# Patient Record
Sex: Male | Born: 1955 | Race: White | Hispanic: No | Marital: Married | Smoking: Current every day smoker
Health system: Southern US, Community
[De-identification: ages and names within clinical notes are randomized; demographics above are authoritative.]

## PROBLEM LIST (undated history)

## (undated) DIAGNOSIS — I10 Essential (primary) hypertension: Secondary | ICD-10-CM

## (undated) DIAGNOSIS — F419 Anxiety disorder, unspecified: Secondary | ICD-10-CM

## (undated) DIAGNOSIS — J984 Other disorders of lung: Secondary | ICD-10-CM

## (undated) DIAGNOSIS — D751 Secondary polycythemia: Secondary | ICD-10-CM

## (undated) HISTORY — DX: Anxiety disorder, unspecified: F41.9

## (undated) HISTORY — DX: Secondary polycythemia: D75.1

## (undated) HISTORY — DX: Essential (primary) hypertension: I10

## (undated) HISTORY — DX: Other disorders of lung: J98.4

---

## 2011-10-07 ENCOUNTER — Ambulatory Visit (INDEPENDENT_AMBULATORY_CARE_PROVIDER_SITE_OTHER): Payer: BC Managed Care – PPO | Admitting: Family Medicine

## 2011-10-07 VITALS — BP 162/98 | HR 62 | Temp 98.2°F | Resp 16 | Ht 67.5 in | Wt 167.2 lb

## 2011-10-07 DIAGNOSIS — F17200 Nicotine dependence, unspecified, uncomplicated: Secondary | ICD-10-CM

## 2011-10-07 DIAGNOSIS — I10 Essential (primary) hypertension: Secondary | ICD-10-CM

## 2011-10-07 DIAGNOSIS — N529 Male erectile dysfunction, unspecified: Secondary | ICD-10-CM

## 2011-10-07 MED ORDER — BENAZEPRIL HCL 10 MG PO TABS: 10.0000 mg | ORAL_TABLET | Freq: Every day | ORAL | Status: DC

## 2011-10-07 MED ORDER — CHLORTHALIDONE 25 MG PO TABS: 25.0000 mg | ORAL_TABLET | Freq: Every day | ORAL | Status: DC

## 2011-10-07 NOTE — Progress Notes (Signed)
  Subjective:    Patient ID: Henry Short, male    DOB: 05-29-1956, 56 y.o.   MRN: [id number]  HPI  Patient presents for routine follow up of hypertension Occasional missed pill; denies side effects  Review of Systems  Respiratory: Negative for shortness of breath.   Cardiovascular: Negative for chest pain and leg swelling.  Neurological: Negative for headaches.   ED less interest and more difficulty achieving and maintaining erection    Objective:   Physical Exam  Constitutional: He appears well-developed.  Neck: Neck supple. No JVD present. No thyromegaly present.  Cardiovascular: Normal rate, regular rhythm and normal heart sounds.   Pulmonary/Chest: Effort normal and breath sounds normal.  Abdominal: Soft. Bowel sounds are normal.       No HSM  Musculoskeletal: Normal range of motion.  Neurological: He is alert.  Skin:       No LE edema       Assessment & Plan:   1. HTN (hypertension)  Lipid panel, Comprehensive metabolic panel, TSH, benazepril (LOTENSIN) 10 MG tablet, chlorthalidone (HYGROTON) 25 MG tablet  2. ED (erectile dysfunction)  Testosterone  3. Tobacco dependence     Anticipatory guidance

## 2011-10-08 LAB — LIPID PANEL
HDL: 38 mg/dL — ABNORMAL LOW (ref >39)
Total CHOL/HDL Ratio: 6.1 Ratio

## 2011-10-08 LAB — COMPREHENSIVE METABOLIC PANEL
ALT: 31 U/L (ref 0–53)
AST: 24 U/L (ref 0–37)
Alkaline Phosphatase: 41 U/L (ref 39–117)
Creat: 0.95 mg/dL (ref 0.50–1.35)
Sodium: 135 mEq/L (ref 135–145)
Total Bilirubin: 0.8 mg/dL (ref 0.3–1.2)
Total Protein: 6.8 g/dL (ref 6.0–8.3)

## 2011-10-13 ENCOUNTER — Other Ambulatory Visit: Payer: Self-pay | Admitting: Family Medicine

## 2011-10-13 DIAGNOSIS — E781 Pure hyperglyceridemia: Secondary | ICD-10-CM

## 2011-10-13 MED ORDER — CHOLINE FENOFIBRATE 135 MG PO CPDR: 135.0000 mg | DELAYED_RELEASE_CAPSULE | Freq: Every day | ORAL | Status: DC

## 2011-10-14 MED ORDER — TADALAFIL 5 MG PO TABS: 5.0000 mg | ORAL_TABLET | Freq: Every day | ORAL | Status: DC | PRN

## 2011-10-14 NOTE — Progress Notes (Signed)
Addended by: Sondra Barges on: 10/14/2011 10:01 AM   Modules accepted: Orders

## 2013-05-27 ENCOUNTER — Ambulatory Visit: Payer: Self-pay

## 2013-05-27 ENCOUNTER — Ambulatory Visit: Payer: Self-pay | Admitting: Emergency Medicine

## 2013-05-27 VITALS — BP 170/100 | HR 91 | Temp 99.2°F | Resp 18 | Ht 67.5 in | Wt 157.0 lb

## 2013-05-27 DIAGNOSIS — F419 Anxiety disorder, unspecified: Secondary | ICD-10-CM

## 2013-05-27 DIAGNOSIS — F17200 Nicotine dependence, unspecified, uncomplicated: Secondary | ICD-10-CM

## 2013-05-27 DIAGNOSIS — F411 Generalized anxiety disorder: Secondary | ICD-10-CM

## 2013-05-27 DIAGNOSIS — J0100 Acute maxillary sinusitis, unspecified: Secondary | ICD-10-CM

## 2013-05-27 DIAGNOSIS — F1920 Other psychoactive substance dependence, uncomplicated: Secondary | ICD-10-CM

## 2013-05-27 DIAGNOSIS — I10 Essential (primary) hypertension: Secondary | ICD-10-CM

## 2013-05-27 LAB — POCT CBC
Granulocyte percent: 77.1 %G (ref 37–80)
Lymph, poc: 0.8 (ref 0.6–3.4)
MCHC: 32.8 g/dL (ref 31.8–35.4)
MID (cbc): 0.7 (ref 0–0.9)
POC Granulocyte: 4.9 (ref 2–6.9)
POC LYMPH PERCENT: 12.3 %L (ref 10–50)
Platelet Count, POC: 106 10*3/uL — AB (ref 142–424)
RDW, POC: 14.3 %

## 2013-05-27 MED ORDER — AMOXICILLIN-POT CLAVULANATE 875-125 MG PO TABS: 1.0000 | ORAL_TABLET | Freq: Two times a day (BID) | ORAL | Status: DC

## 2013-05-27 MED ORDER — ATENOLOL-CHLORTHALIDONE 50-25 MG PO TABS: 1.0000 | ORAL_TABLET | Freq: Every day | ORAL | Status: DC

## 2013-05-27 MED ORDER — LORAZEPAM 0.5 MG PO TABS: 0.5000 mg | ORAL_TABLET | Freq: Two times a day (BID) | ORAL | Status: DC | PRN

## 2013-05-27 NOTE — Progress Notes (Addendum)
Subjective:    Patient ID: Henry Short, male    DOB: 1956/02/19, 57 y.o.   MRN: [id number] This chart was scribed for Lesle Chris, MD by Nicholos Johns, Medical Scribe. This patient's care was started at 12:00 PM.  HPI  HPI Comments: Henry Short is a 57 y.o. male w/hx of hypertension presents to the [facility] complaining of nasal congestion and associated coughing, onset 4 weeks ago. Pt states he is under a lot of stress due to his fathers current illness and working a lot of hours at his job. Pt admits to smoking a pack of cigarettes per day and recently been drinking approximately 5 glasses of wine per day. Pt admits both habits have been occurring consistently over the past 20 years. Pt states he has cut down in the past but has recently picked back up his drinking habits due to stress. Pt takes benazepril for hypertension; denies any recent check ups. Pt does not have a PCP.  BP reading taken in the room was noted at 160/110.   Review of Systems  HENT: Positive for congestion.   Respiratory: Positive for cough.        Objective:   Physical Exam  Vitals reviewed.   CONSTITUTIONAL: Well developed/well nourished HEAD: Normocephalic/atraumatic EYES: EOMI ENMT: Mucous membranes moist NECK: supple no meningeal signs SPINE:entire spine nontender CV: S1/S2 noted, no murmurs/rubs/gallops noted LUNGS: Lungs are clear to auscultation bilaterally, no apparent distress ABDOMEN: soft, nontender, no rebound or guarding GU:no cva tenderness NEURO: Pt is awake/alert, moves all extremitiesx4 EXTREMITIES: pulses normal, full ROM SKIN: warm, color normal patient is ruddy complected. 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68  4.69 - 6.13 M/uL   Hemoglobin 20.1 (*) 14.1 - 18.1 g/dL   HCT, POC 84.1 (*) 32.4 - 53.7 %   MCV 107.9 (*) 80 - 97 fL   MCH, POC 35.4 (*) 27 - 31.2 pg   MCHC 32.8  31.8 - 35.4 g/dL   RDW, POC 40.1     Platelet Count, POC 106 (*) 142 - 424 K/uL   MPV 10.7  0 - 99.8 fL  [facility] reading (PRIMARY) by  Dr. Cleta Alberts chest x-ray shows no acute disease Waters' view shows small amount of fluid in the base of the left maxillary       Assessment & Plan:  We need to address his lifestyle issues which concern is smoking and drinking. We'll treat his sinusitis with Augmentin he does have polycythemia I suspect secondary to his heavy cigarette smoking I personally performed the services described in this documentation, which was  scribed in my presence. The recorded information has been reviewed and is accurate.

## 2013-05-27 NOTE — Patient Instructions (Signed)
Please return to clinic January 1. We will will discuss your  other medical issues at that time

## 2013-05-28 LAB — COMPREHENSIVE METABOLIC PANEL
ALT: 25 U/L (ref 0–53)
AST: 21 U/L (ref 0–37)
Alkaline Phosphatase: 49 U/L (ref 39–117)
CO2: 26 mEq/L (ref 19–32)
Calcium: 9.1 mg/dL (ref 8.4–10.5)
Chloride: 101 mEq/L (ref 96–112)
Creat: 0.79 mg/dL (ref 0.50–1.35)
Sodium: 137 mEq/L (ref 135–145)
Total Bilirubin: 0.9 mg/dL (ref 0.3–1.2)
Total Protein: 6.6 g/dL (ref 6.0–8.3)

## 2013-05-28 LAB — PSA: PSA: 0.71 ng/mL (ref <=4.00)

## 2013-05-28 LAB — TSH: TSH: 0.455 u[IU]/mL (ref 0.350–4.500)

## 2013-09-27 ENCOUNTER — Ambulatory Visit (INDEPENDENT_AMBULATORY_CARE_PROVIDER_SITE_OTHER): Payer: BC Managed Care – PPO | Admitting: Emergency Medicine

## 2013-09-27 VITALS — BP 180/100 | HR 58 | Temp 97.6°F | Resp 16 | Ht 69.0 in | Wt 158.2 lb

## 2013-09-27 DIAGNOSIS — G4700 Insomnia, unspecified: Secondary | ICD-10-CM

## 2013-09-27 DIAGNOSIS — Z23 Encounter for immunization: Secondary | ICD-10-CM

## 2013-09-27 DIAGNOSIS — F419 Anxiety disorder, unspecified: Secondary | ICD-10-CM

## 2013-09-27 DIAGNOSIS — Z0000 Encounter for general adult medical examination without abnormal findings: Secondary | ICD-10-CM

## 2013-09-27 DIAGNOSIS — I10 Essential (primary) hypertension: Secondary | ICD-10-CM

## 2013-09-27 DIAGNOSIS — Z1211 Encounter for screening for malignant neoplasm of colon: Secondary | ICD-10-CM

## 2013-09-27 LAB — TSH: TSH: 0.84 u[IU]/mL (ref 0.350–4.500)

## 2013-09-27 LAB — POCT CBC
GRANULOCYTE PERCENT: 55.8 % (ref 37–80)
HEMATOCRIT: 60.8 % — AB (ref 43.5–53.7)
Hemoglobin: 20.3 g/dL — AB (ref 14.1–18.1)
Lymph, poc: 3.2 (ref 0.6–3.4)
MCH, POC: 36.3 pg — AB (ref 27–31.2)
MCHC: 33.4 g/dL (ref 31.8–35.4)
MCV: 108.8 fL — AB (ref 80–97)
MID (cbc): 0.7 (ref 0–0.9)
MPV: 11.7 fL (ref 0–99.8)
PLATELET COUNT, POC: 159 10*3/uL (ref 142–424)
POC Granulocyte: 4.9 (ref 2–6.9)
POC LYMPH %: 36.3 % (ref 10–50)
POC MID %: 7.9 %M (ref 0–12)
RBC: 5.59 M/uL (ref 4.69–6.13)
RDW, POC: 13.4 %
WBC: 8.8 10*3/uL (ref 4.6–10.2)

## 2013-09-27 LAB — LIPID PANEL
Cholesterol: 240 mg/dL — ABNORMAL HIGH (ref 0–200)
HDL: 68 mg/dL (ref >39)
LDL CALC: 150 mg/dL — AB (ref 0–99)
Total CHOL/HDL Ratio: 3.5 Ratio
Triglycerides: 108 mg/dL (ref <150)
VLDL: 22 mg/dL (ref 0–40)

## 2013-09-27 LAB — COMPREHENSIVE METABOLIC PANEL
ALBUMIN: 4.7 g/dL (ref 3.5–5.2)
ALK PHOS: 38 U/L — AB (ref 39–117)
ALT: 31 U/L (ref 0–53)
AST: 24 U/L (ref 0–37)
BUN: 12 mg/dL (ref 6–23)
CALCIUM: 9.9 mg/dL (ref 8.4–10.5)
CHLORIDE: 96 meq/L (ref 96–112)
CO2: 29 mEq/L (ref 19–32)
Creat: 0.74 mg/dL (ref 0.50–1.35)
Glucose, Bld: 85 mg/dL (ref 70–99)
POTASSIUM: 4 meq/L (ref 3.5–5.3)
Sodium: 136 mEq/L (ref 135–145)
Total Bilirubin: 1.2 mg/dL (ref 0.2–1.2)
Total Protein: 7.4 g/dL (ref 6.0–8.3)

## 2013-09-27 LAB — POCT URINALYSIS DIPSTICK
BILIRUBIN UA: NEGATIVE
Glucose, UA: NEGATIVE
Ketones, UA: NEGATIVE
Leukocytes, UA: NEGATIVE
NITRITE UA: NEGATIVE
PH UA: 7
PROTEIN UA: NEGATIVE
Spec Grav, UA: 1.02
Urobilinogen, UA: 1

## 2013-09-27 LAB — POCT UA - MICROSCOPIC ONLY
Bacteria, U Microscopic: NEGATIVE
CASTS, UR, LPF, POC: NEGATIVE
Crystals, Ur, HPF, POC: NEGATIVE
Epithelial cells, urine per micros: NEGATIVE
YEAST UA: NEGATIVE

## 2013-09-27 LAB — IFOBT (OCCULT BLOOD): IMMUNOLOGICAL FECAL OCCULT BLOOD TEST: NEGATIVE

## 2013-09-27 MED ORDER — BENAZEPRIL HCL 20 MG PO TABS: 20.0000 mg | ORAL_TABLET | Freq: Every day | ORAL | Status: DC

## 2013-09-27 MED ORDER — LORAZEPAM 0.5 MG PO TABS: 0.5000 mg | ORAL_TABLET | Freq: Two times a day (BID) | ORAL | Status: DC | PRN

## 2013-09-27 MED ORDER — BENAZEPRIL HCL 10 MG PO TABS: 10.0000 mg | ORAL_TABLET | Freq: Every day | ORAL | Status: DC

## 2013-09-27 NOTE — Progress Notes (Addendum)
Subjective:    Patient ID: Henry Short, male    DOB: 1955-12-22, 58 y.o.   MRN: [id number] This chart was scribed for Lesle ChrisSteven Marquist Binstock, MD by Danella Maiersaroline Early, ED Scribe. This patient was seen in room 13 and the patient's care was started at 12:41 PM.  Chief Complaint  Patient presents with  . Annual Exam    HPI HPI Comments: Henry Short is a 58 y.o. male with a h/o HTN who presents to the [facility] for an annual physical exam.  He states his BP was elevated at 180-190/115 when he went to the dentist earlier today. He states it was lower when he got here (BP at triage 158/86). He reports a 10 year h/o HTN. He states he is compliant with his BP medication but may forget to take it occasionally.   He is also requesting a refill on lorazepam. He states it helps him sleep better.  Pt states he recently switched over to E-cigarettes. He has smoked 1PPD for 30 years. He states he does not exercise regularly but sometimes he and his wife walk 30-45 minutes around the neighborhood. He drinks 2 glasses of wine per night.  PCP - No primary provider on file.  Past Medical History  Diagnosis Date  . Hypertension   . Anxiety    Current Outpatient Prescriptions on File Prior to Visit  Medication Sig Dispense Refill  . atenolol-chlorthalidone (TENORETIC) 50-25 MG per tablet Take 1 tablet by mouth daily.  30 tablet  11  . LORazepam (ATIVAN) 0.5 MG tablet Take 1 tablet (0.5 mg total) by mouth 2 (two) times daily as needed for anxiety.  30 tablet  1  . amoxicillin-clavulanate (AUGMENTIN) 875-125 MG per tablet Take 1 tablet by mouth 2 (two) times daily.  20 tablet  0  . benazepril (LOTENSIN) 10 MG tablet Take 1 tablet (10 mg total) by mouth daily.  90 tablet  3  . cetirizine (ZYRTEC) 10 MG tablet Take 10 mg by mouth daily.      . chlorthalidone (HYGROTON) 25 MG tablet Take 1 tablet (25 mg total) by mouth daily.  30 tablet  2  . Choline Fenofibrate 135 MG capsule Take 1 capsule  (135 mg total) by mouth daily.  30 capsule  2  . tadalafil (CIALIS) 5 MG tablet Take 1 tablet (5 mg total) by mouth daily as needed for erectile dysfunction.  8 tablet  6   No current facility-administered medications on file prior to visit.   No Known Allergies     Review of Systems  Constitutional: Negative for fever and chills.  Gastrointestinal: Negative for vomiting.  Skin: Negative for rash.  Neurological: Negative for weakness and numbness.       Objective:   Physical Exam CONSTITUTIONAL: Well developed/well nourished HEAD: Normocephalic/atraumatic. Wound on the right cheek at site of biopsy. Face somewhat flushed. EYES: EOMI/PERRL ENMT: Mucous membranes moist NECK: supple no meningeal signs SPINE:entire spine nontender CV: S1/S2 noted, no murmurs/rubs/gallops noted LUNGS: Lungs are clear to auscultation bilaterally, no apparent distress ABDOMEN: soft, nontender, no rebound or guarding GU:no cva tenderness. Rectal exam normal. Prostate normal without masses. NEURO: Pt is awake/alert, moves all extremitiesx4 EXTREMITIES: pulses normal, full ROM SKIN: warm, color normal PSYCH: no abnormalities of mood noted  EKG changes consistent with LVH Filed Vitals:   09/27/13 1127  BP: 158/86  Pulse: 58  Temp: 97.6 F (36.4 C)  TempSrc: Oral  Resp: 16  Height: 5\' 9"  (1.753 m)  Weight: 158 lb 3.2 oz (71.759 kg)  SpO2: 97%  EKG left ventricular hypertrophy  Results for orders placed in visit on 09/27/13  POCT CBC      Result Value Ref Range   WBC 8.8  4.6 - 10.2 K/uL   Lymph, poc 3.2  0.6 - 3.4   POC LYMPH PERCENT 36.3  10 - 50 %L   MID (cbc) 0.7  0 - 0.9   POC MID % 7.9  0 - 12 %M   POC Granulocyte 4.9  2 - 6.9   Granulocyte percent 55.8  37 - 80 %G   RBC 5.59  4.69 - 6.13 M/uL   Hemoglobin 20.3 (*) 14.1 - 18.1 g/dL   HCT, POC 16.160.8 (*) 09.643.5 - 53.7 %   MCV 108.8 (*) 80 - 97 fL   MCH, POC 36.3 (*) 27 - 31.2 pg   MCHC 33.4  31.8 - 35.4 g/dL   RDW, POC 04.513.4      Platelet Count, POC 159  142 - 424 K/uL   MPV 11.7  0 - 99.8 fL  IFOBT (OCCULT BLOOD)      Result Value Ref Range   IFOBT Negative    POCT URINALYSIS DIPSTICK      Result Value Ref Range   Color, UA dark yellow     Clarity, UA clear     Glucose, UA neg     Bilirubin, UA neg     Ketones, UA neg     Spec Grav, UA 1.020     Blood, UA trace-intact     pH, UA 7.0     Protein, UA neg     Urobilinogen, UA 1.0     Nitrite, UA neg     Leukocytes, UA Negative    POCT UA - MICROSCOPIC ONLY      Result Value Ref Range   WBC, Ur, HPF, POC 0-1     RBC, urine, microscopic 2-6     Bacteria, U Microscopic neg     Mucus, UA trace     Epithelial cells, urine per micros neg     Crystals, Ur, HPF, POC neg     Casts, Ur, LPF, POC neg     Yeast, UA neg        Assessment & Plan:  **Disclaimer: This note was dictated with voice recognition software. Similar sounding words can inadvertently be transcribed and this note may contain transcription errors which may not have been corrected upon publication of note.** Blood pressure not at goal. I'm concerned some of this has to do with excessive alcohol intake. Patient has only been taking Tenoretic. I added Lotensin 10 mg a day. He's been on this medication before. I also encouraged him to decrease his alcohol intake

## 2013-09-27 NOTE — Progress Notes (Signed)
   Subjective:    Patient ID: Henry Short, male    DOB: Dec 24, 1955, 58 y.o.   MRN: [id number]  HPI 58 y/o/ male to be seen today for a CPE History of hypertension - atenolol History of Alcohol use Current Smoker   Review of Systems     Objective:   Physical Exam        Assessment & Plan:

## 2013-09-27 NOTE — Patient Instructions (Signed)
I have increased your  blood pressure medication. Recheck 1 week and see if it is improving. Routine labs were drawnHypertension As your heart beats, it forces blood through your arteries. This force is your blood pressure. If the pressure is too high, it is called hypertension (HTN) or high blood pressure. HTN is dangerous because you may have it and not know it. High blood pressure may mean that your heart has to work harder to pump blood. Your arteries may be narrow or stiff. The extra work puts you at risk for heart disease, stroke, and other problems.  Blood pressure consists of two numbers, a higher number over a lower, 110/72, for example. It is stated as "110 over 72." The ideal is below 120 for the top number (systolic) and under 80 for the bottom (diastolic). Write down your blood pressure today. You should pay close attention to your blood pressure if you have certain conditions such as:  Heart failure.  Prior heart attack.  Diabetes  Chronic kidney disease.  Prior stroke.  Multiple risk factors for heart disease. To see if you have HTN, your blood pressure should be measured while you are seated with your arm held at the level of the heart. It should be measured at least twice. A one-time elevated blood pressure reading (especially in the Emergency Department) does not mean that you need treatment. There may be conditions in which the blood pressure is different between your right and left arms. It is important to see your caregiver soon for a recheck. Most people have essential hypertension which means that there is not a specific cause. This type of high blood pressure may be lowered by changing lifestyle factors such as:  Stress.  Smoking.  Lack of exercise.  Excessive weight.  Drug/tobacco/alcohol use.  Eating less salt. Most people do not have symptoms from high blood pressure until it has caused damage to the body. Effective treatment can often prevent, delay or reduce  that damage. TREATMENT  When a cause has been identified, treatment for high blood pressure is directed at the cause. There are a large number of medications to treat HTN. These fall into several categories, and your caregiver will help you select the medicines that are best for you. Medications may have side effects. You should review side effects with your caregiver. If your blood pressure stays high after you have made lifestyle changes or started on medicines,   Your medication(s) may need to be changed.  Other problems may need to be addressed.  Be certain you understand your prescriptions, and know how and when to take your medicine.  Be sure to follow up with your caregiver within the time frame advised (usually within two weeks) to have your blood pressure rechecked and to review your medications.  If you are taking more than one medicine to lower your blood pressure, make sure you know how and at what times they should be taken. Taking two medicines at the same time can result in blood pressure that is too low. SEEK IMMEDIATE MEDICAL CARE IF:  You develop a severe headache, blurred or changing vision, or confusion.  You have unusual weakness or numbness, or a faint feeling.  You have severe chest or abdominal pain, vomiting, or breathing problems. MAKE SURE YOU:   Understand these instructions.  Will watch your condition.  Will get help right away if you are not doing well or get worse. Document Released: 05/24/2005 Document Revised: 08/16/2011 Document Reviewed: 01/12/2008 [employer] Patient  Information 2014 [employer].  

## 2013-09-28 ENCOUNTER — Encounter: Payer: Self-pay | Admitting: Radiology

## 2013-09-28 LAB — PSA: PSA: 0.33 ng/mL (ref <=4.00)

## 2013-10-31 ENCOUNTER — Encounter: Payer: Self-pay | Admitting: Emergency Medicine

## 2014-02-08 ENCOUNTER — Other Ambulatory Visit: Payer: Self-pay

## 2014-02-08 DIAGNOSIS — F419 Anxiety disorder, unspecified: Secondary | ICD-10-CM

## 2014-02-08 NOTE — Telephone Encounter (Signed)
Pharm reqs RF of lorazepam. Pended. 

## 2014-02-09 MED ORDER — LORAZEPAM 0.5 MG PO TABS: 0.5000 mg | ORAL_TABLET | Freq: Two times a day (BID) | ORAL | Status: DC | PRN

## 2014-02-11 NOTE — Telephone Encounter (Signed)
Faxed

## 2014-05-20 IMAGING — CR DG SINUSES 1-2V
1 series · 1 of 1 positions shown · non-contrast
Comparison: None.

CLINICAL DATA: Smoker, congestion, sinus pressure.

EXAM:
PARANASAL SINUSES - 1-2 VIEW

[waters]
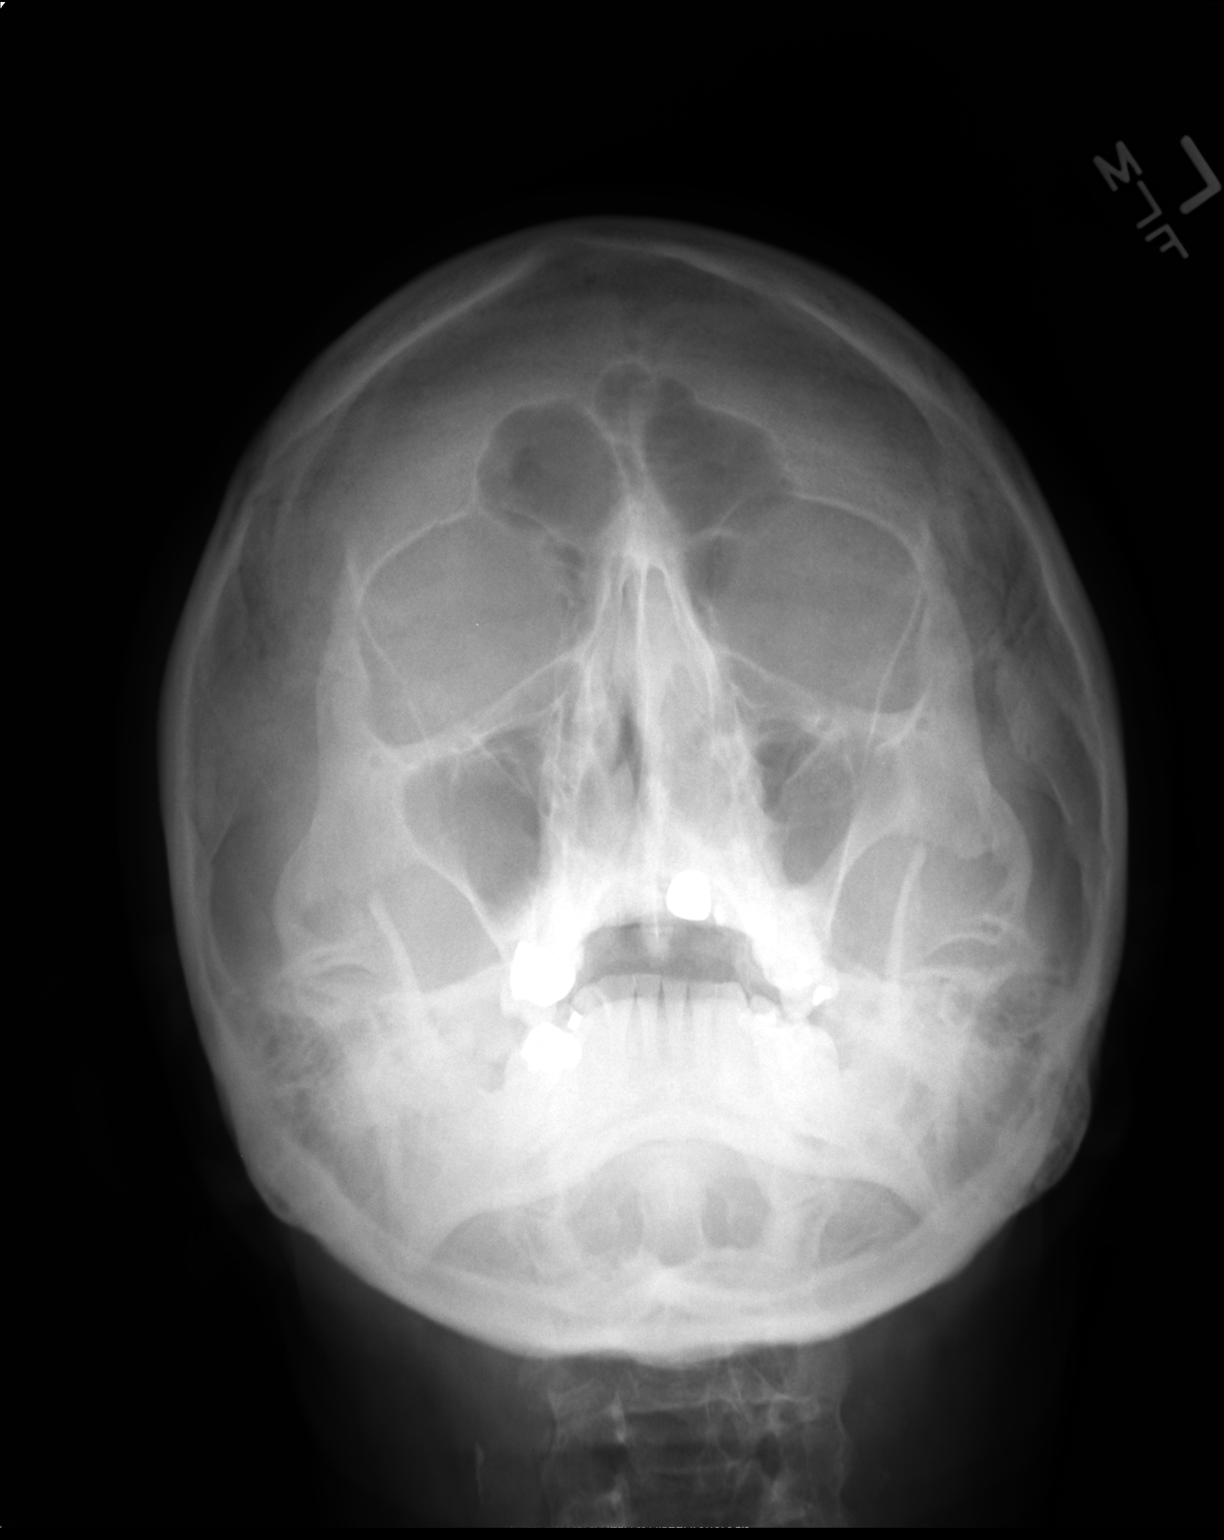

[1 of 1 positions shown; findings below may reference images not displayed]

FINDINGS: Mucosal thickening throughout the left maxillary sinus. No visible
air-fluid levels. Visualized bony structures unremarkable.
IMPRESSION: Evidence of mucosal thickening/ chronic sinusitis in the left
maxillary sinus.

## 2014-07-03 ENCOUNTER — Other Ambulatory Visit: Payer: Self-pay

## 2014-07-03 DIAGNOSIS — F419 Anxiety disorder, unspecified: Secondary | ICD-10-CM

## 2014-07-03 NOTE — Telephone Encounter (Signed)
[facility] called to req RF of lorazepam for pt. Dr Cleta Albertsaub, do you want to RF? He has not seen you since last April. Pended w/note to RTC for more RFs in case you want to give him a RF.

## 2014-07-04 ENCOUNTER — Telehealth: Payer: Self-pay

## 2014-07-04 MED ORDER — LORAZEPAM 0.5 MG PO TABS: 0.5000 mg | ORAL_TABLET | Freq: Two times a day (BID) | ORAL | Status: DC | PRN

## 2014-07-04 NOTE — Telephone Encounter (Signed)
LMVM stating that patient's RX for Lorazepam is ready for pickup at [facility] [address] [facility] [address].

## 2014-10-19 ENCOUNTER — Other Ambulatory Visit: Payer: Self-pay | Admitting: Emergency Medicine

## 2014-11-05 ENCOUNTER — Other Ambulatory Visit: Payer: Self-pay | Admitting: Emergency Medicine

## 2015-02-28 ENCOUNTER — Ambulatory Visit (INDEPENDENT_AMBULATORY_CARE_PROVIDER_SITE_OTHER): Payer: BLUE CROSS/BLUE SHIELD | Admitting: Family Medicine

## 2015-02-28 VITALS — BP 148/82 | HR 53 | Temp 98.2°F | Resp 16 | Ht 69.0 in | Wt 164.0 lb

## 2015-02-28 DIAGNOSIS — Z7289 Other problems related to lifestyle: Secondary | ICD-10-CM

## 2015-02-28 DIAGNOSIS — G4700 Insomnia, unspecified: Secondary | ICD-10-CM | POA: Diagnosis not present

## 2015-02-28 DIAGNOSIS — F1090 Alcohol use, unspecified, uncomplicated: Secondary | ICD-10-CM

## 2015-02-28 DIAGNOSIS — I10 Essential (primary) hypertension: Secondary | ICD-10-CM

## 2015-02-28 DIAGNOSIS — Z789 Other specified health status: Secondary | ICD-10-CM

## 2015-02-28 DIAGNOSIS — N529 Male erectile dysfunction, unspecified: Secondary | ICD-10-CM

## 2015-02-28 DIAGNOSIS — F419 Anxiety disorder, unspecified: Secondary | ICD-10-CM | POA: Diagnosis not present

## 2015-02-28 DIAGNOSIS — Z23 Encounter for immunization: Secondary | ICD-10-CM | POA: Diagnosis not present

## 2015-02-28 DIAGNOSIS — F1099 Alcohol use, unspecified with unspecified alcohol-induced disorder: Secondary | ICD-10-CM

## 2015-02-28 DIAGNOSIS — E785 Hyperlipidemia, unspecified: Secondary | ICD-10-CM

## 2015-02-28 LAB — COMPLETE METABOLIC PANEL WITH GFR
ALT: 28 U/L (ref 9–46)
AST: 21 U/L (ref 10–35)
Albumin: 4.5 g/dL (ref 3.6–5.1)
Alkaline Phosphatase: 43 U/L (ref 40–115)
BUN: 12 mg/dL (ref 7–25)
CHLORIDE: 101 mmol/L (ref 98–110)
CO2: 24 mmol/L (ref 20–31)
Calcium: 9.6 mg/dL (ref 8.6–10.3)
Creat: 0.84 mg/dL (ref 0.70–1.33)
GFR, Est African American: >89 mL/min (ref >=60)
GFR, Est Non African American: >89 mL/min (ref >=60)
GLUCOSE: 86 mg/dL (ref 65–99)
POTASSIUM: 4.3 mmol/L (ref 3.5–5.3)
SODIUM: 135 mmol/L (ref 135–146)
Total Bilirubin: 1.4 mg/dL — ABNORMAL HIGH (ref 0.2–1.2)
Total Protein: 6.8 g/dL (ref 6.1–8.1)

## 2015-02-28 LAB — LIPID PANEL
CHOL/HDL RATIO: 3.7 ratio (ref <=5.0)
CHOLESTEROL: 215 mg/dL — AB (ref 125–200)
HDL: 58 mg/dL (ref >=40)
LDL CALC: 129 mg/dL (ref <130)
Triglycerides: 138 mg/dL (ref <150)
VLDL: 28 mg/dL (ref <30)

## 2015-02-28 MED ORDER — TADALAFIL 5 MG PO TABS: 5.0000 mg | ORAL_TABLET | Freq: Every day | ORAL | Status: DC | PRN

## 2015-02-28 MED ORDER — LORAZEPAM 0.5 MG PO TABS: 0.2500 mg | ORAL_TABLET | Freq: Every evening | ORAL | Status: DC | PRN

## 2015-02-28 MED ORDER — BENAZEPRIL HCL 10 MG PO TABS: 10.0000 mg | ORAL_TABLET | Freq: Every day | ORAL | Status: DC

## 2015-02-28 MED ORDER — ATENOLOL-CHLORTHALIDONE 50-25 MG PO TABS: 1.0000 | ORAL_TABLET | Freq: Every day | ORAL | Status: DC

## 2015-02-28 NOTE — Patient Instructions (Signed)
Keep a record of your blood pressures outside of the office and bring them to the next office visit. Cut back on alcohol to no more than 1-2 drinks per day, and with this change if your blood pressures remain over 140/90 in the next few weeks, return to discuss other changes in her medications. Sooner if worse.  You should receive a call or letter about your lab results within the next week to 10 days.   Flu vaccine was given today.  Follow-up for a complete physical with myself or other provider accepting new patients in the next 6 months.

## 2015-02-28 NOTE — Progress Notes (Addendum)
Subjective:    Patient ID: Henry Short, male    DOB: 14-May-1956, 59 y.o.   MRN: [id number] This chart was scribed for Meredith Staggers, MD by Jolene Provost, Medical Scribe. This patient was seen in Room 14 and the patient's care was started a 3:25 PM.  Chief Complaint  Patient presents with  . Medication Refill    Atenolol, Benazepril, Lorazepam, Cilias  . Flu Vaccine    HPI HPI Comments: Henry Short is a 59 y.o. male with a past hx of ED, anxiety, HTN, and HLD who presents to [facility] reporting for a medication refill. At his last visit pt's BP was slightly elevated. His BP was also elevated at the dentist prior to that visit. He was advised to cut back on his alcohol consumption at last visit, and lotensin  per day was added at that visit. Pt's PCP is Dr. Cleta Alberts, but he does not see him regularly. He has been taking his BP medication once every three days, because he was running out. He states that his BP has been over 150/80. He states he is drinking 3-4 glasses of wine per day. He denies feeling irritated when people ask him about drinking. He does not believe he will have trouble cutting back on alcohol., but not trying.  Pt has not eaten anything yet today. Pt has no family hx of DM. He takes lorazepam at night for sleep. He takes his cialis once every 2-3 weeks. He denies HA, face flushing, light headedness or other issues associated with taking cialis. He denies CP, SOB or difficulty exerting himself. He goes for a long walk 2-3 times per week. He denies past hx of cardiac issues. Pt would like a flu shot today.  Last seen April 2015, for a full physical.  ED: Has taken cialis in the past. Last testosterone test was normal.   HLD: Lab Results  Component Value Date   CHOL 240* 09/27/2013   HDL 68 09/27/2013   LDLCALC 150* 09/27/2013   TRIG 108 09/27/2013   CHOLHDL 3.5 09/27/2013   He was not started on a statin, and was instead recommended to work on diet and exercise.   Wt  Readings from Last 3 Encounters:  02/28/15 164 lb (74.39 kg)  09/27/13 158 lb 3.2 oz (71.759 kg)  05/27/13 157 lb (71.215 kg)   Patient Active Problem List   Diagnosis Date Noted  . Substance dependence 05/27/2013  . HTN (hypertension) 10/07/2011  . ED (erectile dysfunction) 10/07/2011  . Tobacco dependence 10/07/2011   Past Medical History  Diagnosis Date  . Hypertension   . Anxiety    History reviewed. No pertinent past surgical history. No Known Allergies Prior to Admission medications   Medication Sig Start Date End Date Taking? Authorizing Provider  atenolol-chlorthalidone (TENORETIC) 50-25 MG per tablet Take 1 tablet by mouth daily. NO MORE REFILLS WITHOUT OFFICE VISIT - 2ND NOTICE 11/05/14  Yes Collene Gobble, MD  benazepril (LOTENSIN) 10 MG tablet Take 1 tablet (10 mg total) by mouth daily. NO MORE REFILLS WITHOUT OFFICE VISIT - 2ND NOTICE 11/05/14  Yes Collene Gobble, MD  cetirizine (ZYRTEC) 10 MG tablet Take 10 mg by mouth daily.   Yes Historical Provider, MD  LORazepam (ATIVAN) 0.5 MG tablet Take 1 tablet (0.5 mg total) by mouth 2 (two) times daily as needed for anxiety. PATIENT NEEDS OFFICE VISIT FOR ADDITIONAL REFILLS 07/04/14  Yes Collene Gobble, MD  tadalafil (CIALIS) 5 MG tablet Take 1 tablet (5  mg total) by mouth daily as needed for erectile dysfunction. 10/14/11 11/13/11  Sondra Barges, PA-C   Social History   Social History  . Marital Status: Married    Spouse Name: N/A  . Number of Children: N/A  . Years of Education: N/A   Occupational History  . Not on file.   Social History Main Topics  . Smoking status: Current Every Day Smoker -- 1.00 packs/day    Types: Cigarettes  . Smokeless tobacco: Not on file  . Alcohol Use: Not on file  . Drug Use: Not on file  . Sexual Activity: Not on file   Other Topics Concern  . Not on file   Social History Narrative    Review of Systems  Constitutional: Negative for fever, chills, fatigue and unexpected weight change.    Eyes: Negative for visual disturbance.  Respiratory: Negative for cough, chest tightness and shortness of breath.   Cardiovascular: Negative for chest pain, palpitations and leg swelling.  Gastrointestinal: Negative for abdominal pain and blood in stool.  Neurological: Negative for dizziness, light-headedness and headaches.      Objective:   Physical Exam  Constitutional: He is oriented to person, place, and time. He appears well-developed and well-nourished. No distress.  HENT:  Head: Normocephalic and atraumatic.  Eyes: EOM are normal. Pupils are equal, round, and reactive to light.  Neck: Neck supple. No JVD present. Carotid bruit is not present.  Cardiovascular: Normal rate, regular rhythm and normal heart sounds.   No murmur heard. Pulmonary/Chest: Effort normal and breath sounds normal. No respiratory distress. He has no rales.  Abdominal: Soft. There is no tenderness.  Musculoskeletal: Normal range of motion. He exhibits no edema.  Neurological: He is alert and oriented to person, place, and time. Coordination normal.  Skin: Skin is warm and dry. He is not diaphoretic.  Psychiatric: He has a normal mood and affect. His behavior is normal.  Nursing note and vitals reviewed.  Negative cage screening.   Filed Vitals:   02/28/15 1439  BP: 148/82  Pulse: 53  Temp: 98.2 F (36.8 C)  TempSrc: Oral  Resp: 16  Height:  (1.753 m)  Weight: 164 lb (74.39 kg)  SpO2: 97%       Assessment & Plan:   By signing my name below, I, Javier Docker, attest that this documentation has been prepared under the direction and in the presence of Meredith Staggers, MD. Electronically Signed: Javier Docker, ER Scribe. 02/28/2015. 2:53 PM.  Henry Short is a 59 y.o. male Anxiety, Insomnia - Plan: LORazepam (ATIVAN) 0.5 MG tablet  -Intermittent, rare use. Refilled lorazepam at same dose. Can continue half dose as needed. If persistent or worsening symptoms, return to discuss anxiety  and insomnia further.  Essential hypertension - Plan: atenolol-chlorthalidone (TENORETIC) 50-25 MG per tablet, benazepril (LOTENSIN) 10 MG tablet, COMPLETE METABOLIC PANEL WITH GFR, Lipid panel  -Not at control here, but has spaced as medications. Also reports some elevated readings at home. Suspect alcohol component to this. Advised him to cut back to no more than 1-2 drinks per day for now, check home blood pressures, and if remaining over 140/90, return to discuss medication changes further. Refilled medications for 6 months. Plan on physical in that time.  Hyperlipidemia - Plan: COMPLETE METABOLIC PANEL WITH GFR, Lipid panel  -Lipids and CMP pending  Erectile dysfunction, unspecified erectile dysfunction type - Plan: tadalafil (CIALIS) 5 MG tablet  -Intermittent. Tolerating Cialis without difficulty in the past. Refilled  this medication. side effects discussed (including but not limited to headache/flushing, blue discoloration of vision, possible vascular steal and risk of cardiac effects if underlying unknown coronary artery disease, and permanent sensorineural hearing loss) with history of medications. Understanding expressed.  Alcohol use  - Advised to decrease to no more than 1 or 2 drinks per day. Suspect this is some the reason for his elevated blood pressure. Risk of alcohol use and overuse were discussed including but not limited to liver, heart disease. Understanding expressed. Denies addiction or difficulty with cutting back. Advised if he is having difficulty decreasing use of alcohol, to let me know.  Need for influenza vaccination - Plan: Flu Vaccine QUAD 36+ mos IM  -Flu vaccine given.  Meds ordered this encounter  Medications  . atenolol-chlorthalidone (TENORETIC) 50-25 MG per tablet    Sig: Take 1 tablet by mouth daily.    Dispense:  90 tablet    Refill:  1  . benazepril (LOTENSIN) 10 MG tablet    Sig: Take 1 tablet (10 mg total) by mouth daily.    Dispense:  90 tablet     Refill:  1  . LORazepam (ATIVAN) 0.5 MG tablet    Sig: Take 0.5-1 tablets (0.25-0.5 mg total) by mouth at bedtime as needed for anxiety.    Dispense:  30 tablet    Refill:  0  . tadalafil (CIALIS) 5 MG tablet    Sig: Take 1 tablet (5 mg total) by mouth daily as needed for erectile dysfunction.    Dispense:  8 tablet    Refill:  6   Patient Instructions  Keep a record of your blood pressures outside of the office and bring them to the next office visit. Cut back on alcohol to no more than 1-2 drinks per day, and with this change if your blood pressures remain over 140/90 in the next few weeks, return to discuss other changes in her medications. Sooner if worse.  You should receive a call or letter about your lab results within the next week to 10 days.   Flu vaccine was given today.  Follow-up for a complete physical with myself or other provider accepting new patients in the next 6 months.        I personally performed the services described in this documentation, which was scribed in my presence. The recorded information has been reviewed and considered, and addended by me as needed.

## 2015-03-05 ENCOUNTER — Encounter: Payer: Self-pay | Admitting: Family Medicine

## 2015-05-30 ENCOUNTER — Other Ambulatory Visit: Payer: Self-pay

## 2015-05-30 DIAGNOSIS — G4700 Insomnia, unspecified: Secondary | ICD-10-CM

## 2015-05-30 DIAGNOSIS — F419 Anxiety disorder, unspecified: Secondary | ICD-10-CM

## 2015-05-30 NOTE — Telephone Encounter (Signed)
Ok for refill, but will route to PA pool as I am out of town.  

## 2015-05-30 NOTE — Telephone Encounter (Signed)
Pharm reqs Rf of lorazepam. Pended.  

## 2015-06-02 MED ORDER — LORAZEPAM 0.5 MG PO TABS
0.2500 mg | ORAL_TABLET | Freq: Every evening | ORAL | Status: DC | PRN
Start: 2015-06-02 — End: 2015-09-19

## 2015-06-02 NOTE — Telephone Encounter (Signed)
Meds ordered this encounter  Medications  . LORazepam (ATIVAN) 0.5 MG tablet    Sig: Take 0.5-1 tablets (0.25-0.5 mg total) by mouth at bedtime as needed for anxiety.    Dispense:  30 tablet    Refill:  0

## 2015-06-03 NOTE — Telephone Encounter (Signed)
Faxed

## 2015-09-19 ENCOUNTER — Other Ambulatory Visit: Payer: Self-pay

## 2015-09-19 DIAGNOSIS — G4700 Insomnia, unspecified: Secondary | ICD-10-CM

## 2015-09-19 DIAGNOSIS — F419 Anxiety disorder, unspecified: Secondary | ICD-10-CM

## 2015-09-19 MED ORDER — LORAZEPAM 0.5 MG PO TABS: 0.2500 mg | ORAL_TABLET | Freq: Every evening | ORAL | Status: DC | PRN

## 2015-09-19 NOTE — Telephone Encounter (Signed)
Refilled. Follow up in next 2-3 months.

## 2015-09-19 NOTE — Telephone Encounter (Signed)
Pharm reqs RF of lorazepam. Pended. 

## 2015-09-19 NOTE — Telephone Encounter (Signed)
I called pt - advised prescription is at front desk for pick up.   Dr. Neva SeatGreene requests he follow up with him in the next 2-3 months.

## 2015-11-29 ENCOUNTER — Other Ambulatory Visit: Payer: Self-pay | Admitting: Family Medicine

## 2015-12-04 ENCOUNTER — Telehealth: Payer: Self-pay | Admitting: *Deleted

## 2015-12-04 ENCOUNTER — Telehealth: Payer: Self-pay

## 2015-12-04 ENCOUNTER — Other Ambulatory Visit: Payer: Self-pay | Admitting: Family Medicine

## 2015-12-04 DIAGNOSIS — F419 Anxiety disorder, unspecified: Secondary | ICD-10-CM

## 2015-12-04 DIAGNOSIS — G4700 Insomnia, unspecified: Secondary | ICD-10-CM

## 2015-12-04 MED ORDER — LORAZEPAM 0.5 MG PO TABS: 0.2500 mg | ORAL_TABLET | Freq: Every evening | ORAL | Status: DC | PRN

## 2015-12-04 NOTE — Telephone Encounter (Signed)
More info please - I do not see what refill is needed.

## 2015-12-04 NOTE — Telephone Encounter (Signed)
Faxed Rx for lorazepam 0.5 mg to patient's pharmacy. Confirmation page received at 3:36 pm.

## 2015-12-04 NOTE — Telephone Encounter (Signed)
for refill

## 2015-12-04 NOTE — Telephone Encounter (Signed)
Refilled meds for 30 days - needs office visit.

## 2015-12-04 NOTE — Telephone Encounter (Signed)
fax request for refill on Lorazepam 0.5mg  tab #30

## 2015-12-05 NOTE — Telephone Encounter (Signed)
Notified pt. He stated that he never knows what days he is going to be in town ahead of time or what hours that day he will be free due to work sch. I explained our new same day appt scheduling and he will call when he sees that he has a few hours free in town and see if he can catch Dr Neva SeatGreene in the office.

## 2015-12-07 NOTE — Telephone Encounter (Signed)
This is duplicate, done.

## 2015-12-23 ENCOUNTER — Ambulatory Visit (INDEPENDENT_AMBULATORY_CARE_PROVIDER_SITE_OTHER): Payer: Managed Care, Other (non HMO) | Admitting: Family Medicine

## 2015-12-23 VITALS — BP 122/72 | HR 92 | Temp 98.3°F | Resp 18 | Ht 68.5 in | Wt 162.0 lb

## 2015-12-23 DIAGNOSIS — F419 Anxiety disorder, unspecified: Secondary | ICD-10-CM | POA: Diagnosis not present

## 2015-12-23 DIAGNOSIS — N529 Male erectile dysfunction, unspecified: Secondary | ICD-10-CM | POA: Diagnosis not present

## 2015-12-23 DIAGNOSIS — Z720 Tobacco use: Secondary | ICD-10-CM

## 2015-12-23 DIAGNOSIS — I10 Essential (primary) hypertension: Secondary | ICD-10-CM | POA: Diagnosis not present

## 2015-12-23 DIAGNOSIS — G4700 Insomnia, unspecified: Secondary | ICD-10-CM | POA: Diagnosis not present

## 2015-12-23 LAB — COMPLETE METABOLIC PANEL WITH GFR
ALT: 22 U/L (ref 9–46)
AST: 18 U/L (ref 10–35)
Albumin: 4.1 g/dL (ref 3.6–5.1)
Alkaline Phosphatase: 39 U/L — ABNORMAL LOW (ref 40–115)
BUN: 11 mg/dL (ref 7–25)
CALCIUM: 9.5 mg/dL (ref 8.6–10.3)
CHLORIDE: 98 mmol/L (ref 98–110)
CO2: 29 mmol/L (ref 20–31)
CREATININE: 0.98 mg/dL (ref 0.70–1.33)
GFR, Est African American: >89 mL/min (ref >=60)
GFR, Est Non African American: 85 mL/min (ref >=60)
GLUCOSE: 105 mg/dL — AB (ref 65–99)
Potassium: 4.1 mmol/L (ref 3.5–5.3)
Sodium: 137 mmol/L (ref 135–146)
Total Bilirubin: 0.9 mg/dL (ref 0.2–1.2)
Total Protein: 6.6 g/dL (ref 6.1–8.1)

## 2015-12-23 LAB — LIPID PANEL
CHOLESTEROL: 198 mg/dL (ref 125–200)
HDL: 54 mg/dL (ref >=40)
LDL CALC: 121 mg/dL (ref <130)
Total CHOL/HDL Ratio: 3.7 Ratio (ref <=5.0)
Triglycerides: 114 mg/dL (ref <150)
VLDL: 23 mg/dL (ref <30)

## 2015-12-23 MED ORDER — BENAZEPRIL HCL 10 MG PO TABS: ORAL_TABLET | ORAL | Status: DC

## 2015-12-23 MED ORDER — ATENOLOL-CHLORTHALIDONE 50-25 MG PO TABS: 1.0000 | ORAL_TABLET | Freq: Every day | ORAL | Status: DC

## 2015-12-23 MED ORDER — TADALAFIL 5 MG PO TABS: 5.0000 mg | ORAL_TABLET | Freq: Every day | ORAL | Status: AC | PRN

## 2015-12-23 NOTE — Patient Instructions (Addendum)
IF you received an x-ray today, you will receive an invoice from [facility]. Please contact [facility] at [phone] with questions or concerns regarding your invoice.   IF you received labwork today, you will receive an invoice from [employer]. Please contact [facility] at [phone] with questions or concerns regarding your invoice.   Our billing staff will not be able to assist you with questions regarding bills from these companies.  You will be contacted with the lab results as soon as they are available. The fastest way to get your results is to activate your My Chart account. Instructions are located on the last page of this paperwork. If you have not heard from [place] regarding the results in 2 weeks, please contact this office.    Cut back to no more than 1-2 alcoholic drinks per day. No change in medications for now. Follow-up in 6 months for physical.  If I can help with your quitting smoking, please let me know. See other resources below. [place] offers smoking cessation clinics. Registration is required. To register call [phone] or register online at [email or web address]. Smoking Cessation, Tips for Success If you are ready to quit smoking, congratulations! You have chosen to help yourself be healthier. Cigarettes bring nicotine, tar, carbon monoxide, and other irritants into your body. Your lungs, heart, and blood vessels will be able to work better without these poisons. There are many different ways to quit smoking. Nicotine gum, nicotine patches, a nicotine inhaler, or nicotine nasal spray can help with physical craving. Hypnosis, support groups, and medicines help break the habit of smoking. WHAT THINGS CAN I DO TO MAKE QUITTING EASIER?  Here are some tips to help you quit for good:  Pick a date when you will quit smoking completely. Tell all of your friends and family about your plan to quit on that date.  Do not try to  slowly cut down on the number of cigarettes you are smoking. Pick a quit date and quit smoking completely starting on that day.  Throw away all cigarettes.   Clean and remove all ashtrays from your home, work, and car.  On a card, write down your reasons for quitting. Carry the card with you and read it when you get the urge to smoke.  Cleanse your body of nicotine. Drink enough water and fluids to keep your urine clear or pale yellow. Do this after quitting to flush the nicotine from your body.  Learn to predict your moods. Do not let a bad situation be your excuse to have a cigarette. Some situations in your life might tempt you into wanting a cigarette.  Never have "just one" cigarette. It leads to wanting another and another. Remind yourself of your decision to quit.  Change habits associated with smoking. If you smoked while driving or when feeling stressed, try other activities to replace smoking. Stand up when drinking your coffee. Brush your teeth after eating. Sit in a different chair when you read the paper. Avoid alcohol while trying to quit, and try to drink fewer caffeinated beverages. Alcohol and caffeine may urge you to smoke.  Avoid foods and drinks that can trigger a desire to smoke, such as sugary or spicy foods and alcohol.  Ask people who smoke not to smoke around you.  Have something planned to do right after eating or having a cup of coffee. For example, plan to take a walk or exercise.  Try a relaxation exercise to calm  you down and decrease your stress. Remember, you may be tense and nervous for the first 2 weeks after you quit, but this will pass.  Find new activities to keep your hands busy. Play with a pen, coin, or rubber band. Doodle or draw things on paper.  Brush your teeth right after eating. This will help cut down on the craving for the taste of tobacco after meals. You can also try mouthwash.   Use oral substitutes in place of cigarettes. Try using  lemon drops, carrots, cinnamon sticks, or chewing gum. Keep them handy so they are available when you have the urge to smoke.  When you have the urge to smoke, try deep breathing.  Designate your home as a nonsmoking area.  If you are a heavy smoker, ask your health care provider about a prescription for nicotine chewing gum. It can ease your withdrawal from nicotine.  Reward yourself. Set aside the cigarette money you save and buy yourself something nice.  Look for support from others. Join a support group or smoking cessation program. Ask someone at home or at work to help you with your plan to quit smoking.  Always ask yourself, "Do I need this cigarette or is this just a reflex?" Tell yourself, "Today, I choose not to smoke," or "I do not want to smoke." You are reminding yourself of your decision to quit.  Do not replace cigarette smoking with electronic cigarettes (commonly called e-cigarettes). The safety of e-cigarettes is unknown, and some may contain harmful chemicals.  If you relapse, do not give up! Plan ahead and think about what you will do the next time you get the urge to smoke. HOW WILL I FEEL WHEN I QUIT SMOKING? You may have symptoms of withdrawal because your body is used to nicotine (the addictive substance in cigarettes). You may crave cigarettes, be irritable, feel very hungry, cough often, get headaches, or have difficulty concentrating. The withdrawal symptoms are only temporary. They are strongest when you first quit but will go away within 10-14 days. When withdrawal symptoms occur, stay in control. Think about your reasons for quitting. Remind yourself that these are signs that your body is healing and getting used to being without cigarettes. Remember that withdrawal symptoms are easier to treat than the major diseases that smoking can cause.  Even after the withdrawal is over, expect periodic urges to smoke. However, these cravings are generally short lived and will  go away whether you smoke or not. Do not smoke! WHAT RESOURCES ARE AVAILABLE TO HELP ME QUIT SMOKING? Your health care provider can direct you to community resources or hospitals for support, which may include:  Group support.  Education.  Hypnosis.  Therapy.   This information is not intended to replace advice given to you by your health care provider. Make sure you discuss any questions you have with your health care provider.   Document Released: 02/20/2004 Document Revised: 06/14/2014 Document Reviewed: 11/09/2012 [employer] Interactive Patient Education [employer]

## 2015-12-23 NOTE — Progress Notes (Signed)
By signing my name below, I, Mesha Guinyard, attest that this documentation has been prepared under the direction and in the presence of Meredith StaggersJeffrey Deshawna Mcneece, MD.  Electronically Signed: Arvilla MarketMesha Guinyard, Medical Scribe. 12/23/2015. 11:16 AM.  Subjective:    Patient ID: Henry Short, male    DOB: 1956-05-04, 60 y.o.   MRN: [id number]  HPI Chief Complaint  Patient presents with  . Medication Refill    atenolol, benazepril, ativan    HPI Comments: Henry RedderRobert Short is a 60 y.o. male with a PMHx of HTN who presents to the [facility] for HTN follow-up. Pt would like to get a medication refill. Pt has been fasting.  HTN: Takes Tenoretic and Lotensin QD. Uncontrolled last visit as he was spacing out medications over a few days. We continued the same regemin with plan to check up in 6 months. Pt rarely misses his medications- mainly on Sundays, once a week. Pt denies experiencing any side affecets on his bp medications.  Anxiety with insomina: See last visit September 2016. At that time he was drinking 3-4 glasses of wine a day. Rare use of Ativan, with half dose PRN for anxiety or insomnia. I did advise to cut back to 1-2 drinks per day. Pt reports taking Ativan 3 nights a week before bed. Pt takes the whole pill since the pills are too small to cut in half now. Pt denies sleep disturbances while taking Ativan. Pt still takes about 3 cups of white wine at night.  Erectile dysfunction: He has used Cialis with out side affects.  Tobacco Abuse: Pt smokes a pack a day. Pt thinks about quitting everyday. Pt tries to hold off on his first cigarette in the day to quit smoking.  cetirizine (ZYRTEC) 10 MG tablet Take 10 mg by mouth daily.   Yes Historical Provider, MD  LORazepam (ATIVAN) 0.5 MG tablet Take 0.5-1 tablets (0.25-0.5 mg total) by mouth at bedtime as needed for anxiety. 12/04/15  Yes Shade FloodJeffrey R Roshard Rezabek, MD  tadalafil (CIALIS) 5 MG tablet Take 1 tablet (5 mg total) by mouth daily as  needed for erectile dysfunction. 02/28/15 03/30/15  Shade FloodJeffrey R Tai Skelly, MD   Social History   Social History  . Marital Status: Married    Spouse Name: N/A  . Number of Children: N/A  . Years of Education: N/A   Occupational History  . Not on file.   Social History Main Topics  . Smoking status: Current Every Day Smoker -- 1.00 packs/day    Types: Cigarettes  . Smokeless tobacco: Not on file  . Alcohol Use: Not on file  . Drug Use: Not on file  . Sexual Activity: Not on file   Other Topics Concern  . Not on file   Social History Narrative   Review of Systems  Constitutional: Negative for fatigue and unexpected weight change.  Eyes: Negative for photophobia and visual disturbance.  Respiratory: Negative for cough, chest tightness and shortness of breath.   Cardiovascular: Negative for chest pain, palpitations and leg swelling.  Gastrointestinal: Negative for abdominal pain and blood in stool.  Neurological: Negative for dizziness, light-headedness and headaches.   Objective:  BP 122/72 mmHg  Pulse 92  Temp(Src) 98.3 F (36.8 C) (Oral)  Resp 18  Ht 5' 8.5" (1.74 m)  Wt 162 lb (73.483 kg)  BMI 24.27 kg/m2  SpO2 97%  Physical Exam  Constitutional: He is oriented to person, place, and time. He appears well-developed and well-nourished.  HENT:  Head: Normocephalic  and atraumatic.  Eyes: EOM are normal. Pupils are equal, round, and reactive to light.  Neck: No JVD present. Carotid bruit is not present.  Cardiovascular: Normal rate, regular rhythm and normal heart sounds.   No murmur heard. Pulmonary/Chest: Effort normal and breath sounds normal. He has no rales.  Musculoskeletal: He exhibits no edema.  Neurological: He is alert and oriented to person, place, and time.  Skin: Skin is warm and dry.  Psychiatric: He has a normal mood and affect.  Vitals reviewed.  Assessment & Plan:   Henry Short is a 60 y.o. male Anxiety, Insomnia  - Overall stable. Okay to  continue Ativan, but can discuss a pharmacist if other generic with different size may be easier to split. Also advised to cut back to no than more than 1-2 alcoholic drinks per day  Essential hypertension - Plan: COMPLETE METABOLIC PANEL WITH GFR, Lipid panel, atenolol-chlorthalidone (TENORETIC) 50-25 MG tablet, benazepril (LOTENSIN) 10 MG tablet  Tobacco abuse  - Cessation discussed, resources provided on after visit summary.  Erectile dysfunction, unspecified erectile dysfunction type - Plan: tadalafil (CIALIS) 5 MG tablet  - Cialis Rx given - use lowest effective dose. Side effects discussed (including but not limited to headache/flushing, blue discoloration of vision, possible vascular steal and risk of cardiac effects if underlying unknown coronary artery disease, and permanent sensorineural hearing loss). Understanding expressed.   Meds ordered this encounter  Medications  . atenolol-chlorthalidone (TENORETIC) 50-25 MG tablet    Sig: Take 1 tablet by mouth daily.    Dispense:  90 tablet    Refill:  1  . benazepril (LOTENSIN) 10 MG tablet    Sig: TAKE 1 TABLET (10 MG TOTAL) BY MOUTH DAILY.    Dispense:  90 tablet    Refill:  1  . tadalafil (CIALIS) 5 MG tablet    Sig: Take 1 tablet (5 mg total) by mouth daily as needed for erectile dysfunction.    Dispense:  8 tablet    Refill:  6   Patient Instructions       IF you received an x-ray today, you will receive an invoice from [facility]. Please contact [facility] at [phone] with questions or concerns regarding your invoice.   IF you received labwork today, you will receive an invoice from [employer]. Please contact [facility] at [phone] with questions or concerns regarding your invoice.   Our billing staff will not be able to assist you with questions regarding bills from these companies.  You will be contacted with the lab results as soon as they are available. The  fastest way to get your results is to activate your My Chart account. Instructions are located on the last page of this paperwork. If you have not heard from [place] regarding the results in 2 weeks, please contact this office.    Cut back to no more than 1-2 alcoholic drinks per day. No change in medications for now. Follow-up in 6 months for physical.  If I can help with your quitting smoking, please let me know. See other resources below. [facility] offers smoking cessation clinics. Registration is required. To register call [phone] or register online at [email or web address]. Smoking Cessation, Tips for Success If you are ready to quit smoking, congratulations! You have chosen to help yourself be healthier. Cigarettes bring nicotine, tar, carbon monoxide, and other irritants into your body. Your lungs, heart, and blood vessels will be able to work better without these poisons. There are many different  ways to quit smoking. Nicotine gum, nicotine patches, a nicotine inhaler, or nicotine nasal spray can help with physical craving. Hypnosis, support groups, and medicines help break the habit of smoking. WHAT THINGS CAN I DO TO MAKE QUITTING EASIER?  Here are some tips to help you quit for good:  Pick a date when you will quit smoking completely. Tell all of your friends and family about your plan to quit on that date.  Do not try to slowly cut down on the number of cigarettes you are smoking. Pick a quit date and quit smoking completely starting on that day.  Throw away all cigarettes.   Clean and remove all ashtrays from your home, work, and car.  On a card, write down your reasons for quitting. Carry the card with you and read it when you get the urge to smoke.  Cleanse your body of nicotine. Drink enough water and fluids to keep your urine clear or pale yellow. Do this after quitting to flush the nicotine from your body.  Learn to predict your moods. Do not let a bad situation be your excuse  to have a cigarette. Some situations in your life might tempt you into wanting a cigarette.  Never have "just one" cigarette. It leads to wanting another and another. Remind yourself of your decision to quit.  Change habits associated with smoking. If you smoked while driving or when feeling stressed, try other activities to replace smoking. Stand up when drinking your coffee. Brush your teeth after eating. Sit in a different chair when you read the paper. Avoid alcohol while trying to quit, and try to drink fewer caffeinated beverages. Alcohol and caffeine may urge you to smoke.  Avoid foods and drinks that can trigger a desire to smoke, such as sugary or spicy foods and alcohol.  Ask people who smoke not to smoke around you.  Have something planned to do right after eating or having a cup of coffee. For example, plan to take a walk or exercise.  Try a relaxation exercise to calm you down and decrease your stress. Remember, you may be tense and nervous for the first 2 weeks after you quit, but this will pass.  Find new activities to keep your hands busy. Play with a pen, coin, or rubber band. Doodle or draw things on paper.  Brush your teeth right after eating. This will help cut down on the craving for the taste of tobacco after meals. You can also try mouthwash.   Use oral substitutes in place of cigarettes. Try using lemon drops, carrots, cinnamon sticks, or chewing gum. Keep them handy so they are available when you have the urge to smoke.  When you have the urge to smoke, try deep breathing.  Designate your home as a nonsmoking area.  If you are a heavy smoker, ask your health care provider about a prescription for nicotine chewing gum. It can ease your withdrawal from nicotine.  Reward yourself. Set aside the cigarette money you save and buy yourself something nice.  Look for support from others. Join a support group or smoking cessation program. Ask someone at home or at work to  help you with your plan to quit smoking.  Always ask yourself, "Do I need this cigarette or is this just a reflex?" Tell yourself, "Today, I choose not to smoke," or "I do not want to smoke." You are reminding yourself of your decision to quit.  Do not replace cigarette smoking with electronic cigarettes (  commonly called e-cigarettes). The safety of e-cigarettes is unknown, and some may contain harmful chemicals.  If you relapse, do not give up! Plan ahead and think about what you will do the next time you get the urge to smoke. HOW WILL I FEEL WHEN I QUIT SMOKING? You may have symptoms of withdrawal because your body is used to nicotine (the addictive substance in cigarettes). You may crave cigarettes, be irritable, feel very hungry, cough often, get headaches, or have difficulty concentrating. The withdrawal symptoms are only temporary. They are strongest when you first quit but will go away within 10-14 days. When withdrawal symptoms occur, stay in control. Think about your reasons for quitting. Remind yourself that these are signs that your body is healing and getting used to being without cigarettes. Remember that withdrawal symptoms are easier to treat than the major diseases that smoking can cause.  Even after the withdrawal is over, expect periodic urges to smoke. However, these cravings are generally short lived and will go away whether you smoke or not. Do not smoke! WHAT RESOURCES ARE AVAILABLE TO HELP ME QUIT SMOKING? Your health care provider can direct you to community resources or hospitals for support, which may include:  Group support.  Education.  Hypnosis.  Therapy.   This information is not intended to replace advice given to you by your health care provider. Make sure you discuss any questions you have with your health care provider.   Document Released: 02/20/2004 Document Revised: 06/14/2014 Document Reviewed: 11/09/2012 Elsevier Interactive Patient Education [employer].      I personally performed the services described in this documentation, which was scribed in my presence. The recorded information has been reviewed and considered, and addended by me as needed.   Signed,   Meredith Staggers, MD [facility].  12/24/2015 11:38 AM

## 2015-12-31 ENCOUNTER — Encounter: Payer: Self-pay | Admitting: *Deleted

## 2016-01-28 ENCOUNTER — Other Ambulatory Visit: Payer: Self-pay

## 2016-01-28 DIAGNOSIS — F419 Anxiety disorder, unspecified: Secondary | ICD-10-CM

## 2016-01-28 DIAGNOSIS — G4700 Insomnia, unspecified: Secondary | ICD-10-CM

## 2016-01-28 NOTE — Telephone Encounter (Signed)
Pharm reqs RF of ativan. Pended. Pt had reported at OV 7/18 that he is taking 1 whole tablet because pill too small to break, but I left sig the same at 1/2 - 1. Last RF 6/29.

## 2016-01-29 MED ORDER — LORAZEPAM 0.5 MG PO TABS: 0.2500 mg | ORAL_TABLET | Freq: Every evening | ORAL | 0 refills | Status: DC | PRN

## 2016-01-29 NOTE — Telephone Encounter (Signed)
Prescription signed, ready for pick up.

## 2016-01-29 NOTE — Telephone Encounter (Signed)
Faxed

## 2016-03-19 ENCOUNTER — Other Ambulatory Visit: Payer: Self-pay

## 2016-03-19 DIAGNOSIS — F419 Anxiety disorder, unspecified: Secondary | ICD-10-CM

## 2016-03-19 MED ORDER — LORAZEPAM 0.5 MG PO TABS: 0.2500 mg | ORAL_TABLET | Freq: Every evening | ORAL | 0 refills | Status: DC | PRN

## 2016-03-19 NOTE — Telephone Encounter (Signed)
Pharm reqs RF of lorazepam. Last OV for this 7/18, last RF 8/24 for #30.

## 2016-03-19 NOTE — Telephone Encounter (Signed)
Refilled

## 2016-03-20 NOTE — Telephone Encounter (Signed)
2 pharmacies listed, so I did not call in. rx up front for pick up.

## 2016-03-20 NOTE — Telephone Encounter (Signed)
Called to pharmacy 

## 2016-04-26 ENCOUNTER — Other Ambulatory Visit: Payer: Self-pay

## 2016-04-26 DIAGNOSIS — F419 Anxiety disorder, unspecified: Secondary | ICD-10-CM

## 2016-04-26 NOTE — Telephone Encounter (Signed)
Pharm reqs RF of lorazepam. Last OV 7/18, last RF 10/13.

## 2016-04-27 MED ORDER — LORAZEPAM 0.5 MG PO TABS: 0.2500 mg | ORAL_TABLET | Freq: Every evening | ORAL | 0 refills | Status: DC | PRN

## 2016-04-27 NOTE — Telephone Encounter (Signed)
Refilled. Due for office visit in January for a physical.

## 2016-04-27 NOTE — Telephone Encounter (Signed)
Notified pt on VM of RF and need for CPE in Jan.

## 2016-06-04 ENCOUNTER — Other Ambulatory Visit: Payer: Self-pay

## 2016-06-04 DIAGNOSIS — F419 Anxiety disorder, unspecified: Secondary | ICD-10-CM

## 2016-06-04 NOTE — Telephone Encounter (Signed)
Pharm faxed req for RF of lorazepam. Last OV in July, last RF 11/21.

## 2016-06-05 MED ORDER — LORAZEPAM 0.5 MG PO TABS: 0.2500 mg | ORAL_TABLET | Freq: Every evening | ORAL | 0 refills | Status: DC | PRN

## 2016-06-05 NOTE — Telephone Encounter (Signed)
Refilled, but follow up prior to this Rx running out. Can sign Rx when in office 06/08/16

## 2016-06-08 NOTE — Telephone Encounter (Signed)
Called to [facility] and l/m needs ov for refills.

## 2016-07-15 NOTE — Telephone Encounter (Signed)
Rx 03/19/16 for Ativan was not picked up - destroyed.

## 2016-07-29 ENCOUNTER — Other Ambulatory Visit: Payer: Self-pay | Admitting: Family Medicine

## 2016-07-29 DIAGNOSIS — I10 Essential (primary) hypertension: Secondary | ICD-10-CM

## 2016-07-29 MED ORDER — ATENOLOL-CHLORTHALIDONE 50-25 MG PO TABS: 1.0000 | ORAL_TABLET | Freq: Every day | ORAL | 0 refills | Status: AC

## 2016-07-29 MED ORDER — BENAZEPRIL HCL 10 MG PO TABS: 10.0000 mg | ORAL_TABLET | Freq: Every day | ORAL | 0 refills | Status: AC

## 2016-07-29 NOTE — Telephone Encounter (Signed)
Meds ordered this encounter  Medications  . DISCONTD: benazepril (LOTENSIN) 10 MG tablet    Sig: TAKE 1 TABLET (10 MG TOTAL) BY MOUTH DAILY.    Dispense:  30 tablet    Refill:  0    Need office visit for additional refills.  Marland Kitchen. DISCONTD: atenolol-chlorthalidone (TENORETIC) 50-25 MG tablet    Sig: TAKE 1 TABLET BY MOUTH DAILY.    Dispense:  30 tablet    Refill:  0    Need office visit for additional refills.  Marland Kitchen. atenolol-chlorthalidone (TENORETIC) 50-25 MG tablet    Sig: Take 1 tablet by mouth daily.    Dispense:  60 tablet    Refill:  0    Order Specific Question:   Supervising Provider    Answer:   SHAW, EVA N [[id number]]  . benazepril (LOTENSIN) 10 MG tablet    Sig: Take 1 tablet (10 mg total) by mouth daily.    Dispense:  60 tablet    Refill:  0    Order Specific Question:   Supervising Provider    Answer:   Clelia CroftSHAW, EVA N [phone][[id number]]   Spoke with patient re: above and need for CPE with Dr. Neva SeatGreene. Patient will not have health insurance until 09/2016. Transferred call to Ms. Burton to schedule CPE in 09/2016.

## 2016-09-08 ENCOUNTER — Other Ambulatory Visit: Payer: Self-pay | Admitting: Family Medicine

## 2016-09-08 DIAGNOSIS — F419 Anxiety disorder, unspecified: Secondary | ICD-10-CM

## 2016-09-08 NOTE — Telephone Encounter (Signed)
Due for office visit as noted on previous refill request, but I see he has an appointment with me in May. I refilled medication for now, but he must keep the appointment for any further refills

## 2016-09-09 ENCOUNTER — Other Ambulatory Visit: Payer: Self-pay | Admitting: Family Medicine

## 2016-09-09 DIAGNOSIS — F419 Anxiety disorder, unspecified: Secondary | ICD-10-CM

## 2016-09-09 NOTE — Telephone Encounter (Signed)
Called to [facility] and advised needs ov

## 2016-09-09 NOTE — Telephone Encounter (Signed)
Pt called to check the status of his medications refill. Advise pt per notes the lorazpam was filled and for the atenolol and benazepril Dr. Neva Seat is requesting an OV. Pt states that his only day off is 09/16/16 (Thus), Dr. Neva Seat is 100% book that day. Offer pt another day, he said  "no, that is ok".

## 2016-09-30 ENCOUNTER — Encounter: Payer: Managed Care, Other (non HMO) | Admitting: Family Medicine

## 2016-10-21 ENCOUNTER — Encounter: Payer: Managed Care, Other (non HMO) | Admitting: Family Medicine

## 2017-11-09 ENCOUNTER — Encounter: Payer: Self-pay | Admitting: Hematology & Oncology

## 2017-11-21 ENCOUNTER — Encounter: Payer: Self-pay | Admitting: Hematology & Oncology

## 2017-11-21 ENCOUNTER — Inpatient Hospital Stay: Payer: 59

## 2017-11-21 ENCOUNTER — Inpatient Hospital Stay: Payer: 59 | Attending: Hematology & Oncology | Admitting: Hematology & Oncology

## 2017-11-21 ENCOUNTER — Other Ambulatory Visit: Payer: Self-pay

## 2017-11-21 VITALS — BP 133/78 | HR 55 | Temp 98.2°F | Resp 16 | Wt 162.0 lb

## 2017-11-21 DIAGNOSIS — I10 Essential (primary) hypertension: Secondary | ICD-10-CM | POA: Diagnosis not present

## 2017-11-21 DIAGNOSIS — D751 Secondary polycythemia: Secondary | ICD-10-CM | POA: Diagnosis not present

## 2017-11-21 DIAGNOSIS — F419 Anxiety disorder, unspecified: Secondary | ICD-10-CM | POA: Insufficient documentation

## 2017-11-21 DIAGNOSIS — J984 Other disorders of lung: Secondary | ICD-10-CM

## 2017-11-21 DIAGNOSIS — F17210 Nicotine dependence, cigarettes, uncomplicated: Secondary | ICD-10-CM | POA: Diagnosis not present

## 2017-11-21 DIAGNOSIS — Z79899 Other long term (current) drug therapy: Secondary | ICD-10-CM | POA: Insufficient documentation

## 2017-11-21 HISTORY — DX: Other disorders of lung: D75.1

## 2017-11-21 LAB — CBC WITH DIFFERENTIAL (CANCER CENTER ONLY)
BASOS PCT: 1 %
Basophils Absolute: 0.1 10*3/uL (ref 0.0–0.1)
EOS ABS: 0.2 10*3/uL (ref 0.0–0.5)
Eosinophils Relative: 2 %
HEMATOCRIT: 56.5 % — AB (ref 38.7–49.9)
HEMOGLOBIN: 19.8 g/dL — AB (ref 13.0–17.1)
LYMPHS ABS: 2.7 10*3/uL (ref 0.9–3.3)
Lymphocytes Relative: 27 %
MCH: 34.7 pg — ABNORMAL HIGH (ref 28.0–33.4)
MCHC: 35 g/dL (ref 32.0–35.9)
MCV: 99.1 fL — ABNORMAL HIGH (ref 82.0–98.0)
MONOS PCT: 12 %
Monocytes Absolute: 1.2 10*3/uL — ABNORMAL HIGH (ref 0.1–0.9)
NEUTROS ABS: 5.9 10*3/uL (ref 1.5–6.5)
Neutrophils Relative %: 58 %
Platelet Count: 150 10*3/uL (ref 145–400)
RBC: 5.7 MIL/uL (ref 4.20–5.70)
RDW: 12.5 % (ref 11.1–15.7)
WBC: 10 10*3/uL (ref 4.0–10.0)

## 2017-11-21 LAB — CMP (CANCER CENTER ONLY)
ALBUMIN: 4.2 g/dL (ref 3.5–5.0)
ALK PHOS: 42 U/L (ref 40–150)
ALT: 30 U/L (ref 0–55)
AST: 21 U/L (ref 5–34)
Anion gap: 8 (ref 3–11)
BILIRUBIN TOTAL: 0.4 mg/dL (ref 0.2–1.2)
BUN: 15 mg/dL (ref 7–26)
CALCIUM: 9.8 mg/dL (ref 8.4–10.4)
CO2: 28 mmol/L (ref 22–29)
CREATININE: 0.97 mg/dL (ref 0.70–1.30)
Chloride: 99 mmol/L (ref 98–109)
GFR, Est AFR Am: >60 mL/min (ref >60)
GFR, Estimated: >60 mL/min (ref >60)
GLUCOSE: 115 mg/dL (ref 70–140)
Potassium: 3.8 mmol/L (ref 3.5–5.1)
SODIUM: 135 mmol/L — AB (ref 136–145)
TOTAL PROTEIN: 7.1 g/dL (ref 6.4–8.3)

## 2017-11-21 LAB — RETICULOCYTES
RBC.: 5.51 MIL/uL (ref 4.20–5.82)
Retic Count, Absolute: 82.7 10*3/uL (ref 34.8–93.9)
Retic Ct Pct: 1.5 % (ref 0.8–1.8)

## 2017-11-21 LAB — LACTATE DEHYDROGENASE: LDH: 176 U/L (ref 125–245)

## 2017-11-21 LAB — SAVE SMEAR

## 2017-11-21 NOTE — Progress Notes (Signed)
Referral MD  Reason for Referral: Erythrocytosis  No chief complaint on file. : I am not sure why I am here.  HPI: [name] is a very nice 62 year old white male.  He has been in good health.  He does have some hypertension.  He is been noted to have significant erythrocytosis.  Back in 2014, his hemoglobin was 20.1 hematocrit 61.3.  MCV was 108.  He had a normal white cell count.  2015, his hemoglobin was 10.3 and hematocrit 60.8.  His MCV was 109.  Most recently in May 2019, his white cell count was 9.2.  Hemoglobin 20.2 and hematocrit 59.6.  MCV was 106.  Platelet count 147,000.  He was then referred to the [facility] for an evaluation.  He denies any kind of headaches.  He has no pruritus.  He is still smoking.  He is probably smoke for about 35 years.  He says he was always a pack a day smoker.  He has no obvious occupational exposures.  He sells insurance.  There is no history in the family of any blood problems.  His mother had a stroke and heart attack when she was 60 years old.  He denies any type of shortness of breath.  There is no chest wall pain.  He has had no change in bowel or bladder habits.  He is not noted any leg swelling.  Overall, his performance status is ECOG 1.  He has a small amount of alcohol.   Past Medical History:  Diagnosis Date  . Anxiety   . Hypertension   :  History reviewed. No pertinent surgical history.:   Current Outpatient Medications:  .  atenolol-chlorthalidone (TENORETIC) 50-25 MG tablet, Take 1 tablet by mouth daily., Disp: 60 tablet, Rfl: 0 .  benazepril (LOTENSIN) 10 MG tablet, Take 1 tablet (10 mg total) by mouth daily., Disp: 60 tablet, Rfl: 0 .  cetirizine (ZYRTEC) 10 MG tablet, Take 10 mg by mouth daily., Disp: , Rfl:  .  LORazepam (ATIVAN) 0.5 MG tablet, TAKE 1/2 TO 1 TABLET BY MOUTH AT BEDTIME AS NEEDED FOR ANXIETY, Disp: 30 tablet, Rfl: 0 .  tadalafil (CIALIS) 5 MG tablet, Take 1 tablet (5 mg  total) by mouth daily as needed for erectile dysfunction., Disp: 8 tablet, Rfl: 6:  :  No Known Allergies:  Family History  Problem Relation Age of Onset  . Hypertension Mother   . Stroke Mother   . Heart disease Father   . Hypertension Father   :  Social History   Socioeconomic History  . Marital status: Married    Spouse name: Not on file  . Number of children: Not on file  . Years of education: Not on file  . Highest education level: Not on file  Occupational History  . Not on file  Social Needs  . Financial resource strain: Not on file  . Food insecurity:    Worry: Not on file    Inability: Not on file  . Transportation needs:    Medical: Not on file    Non-medical: Not on file  Tobacco Use  . Smoking status: Current Every Day Smoker    Packs/day: 1.00    Types: Cigarettes  Substance and Sexual Activity  . Alcohol use: Not on file  . Drug use: Not on file  . Sexual activity: Not on file  Lifestyle  . Physical activity:    Days per week: Not on file    Minutes per  session: Not on file  . Stress: Not on file  Relationships  . Social connections:    Talks on phone: Not on file    Gets together: Not on file    Attends religious service: Not on file    Active member of club or organization: Not on file    Attends meetings of clubs or organizations: Not on file    Relationship status: Not on file  . Intimate partner violence:    Fear of current or ex partner: Not on file    Emotionally abused: Not on file    Physically abused: Not on file    Forced sexual activity: Not on file  Other Topics Concern  . Not on file  Social History Narrative  . Not on file  :  Review of Systems  Constitutional: Negative.   HENT: Negative.   Eyes: Negative.   Respiratory: Negative.   Cardiovascular: Negative.   Gastrointestinal: Negative.   Genitourinary: Negative.   Musculoskeletal: Negative.   Skin: Negative.   Neurological: Negative.   Endo/Heme/Allergies:  Negative.   Psychiatric/Behavioral: Negative.      Exam: Well-developed and well-nourished white male in no obvious distress.  He has a remarkable ruddy complexion.  His vital signs show temperature of 98.2.  Pulse 55.  Blood pressure 133/78.  Weight is 162 pounds.  Head neck exam shows no ocular or oral lesions.  He has no obvious conjunctival inflammation.  There is no corneal erythema.  There are no lymph nodes in the neck.  Thyroid is nonpalpable.  Lungs are clear to percussion and auscultation bilaterally.  Cardiac exam regular rate and rhythm with no murmurs, rubs or bruits.  Abdomen is soft.  He has good bowel sounds.  There is no fluid wave.  There is no palpable liver or spleen tip.  Back exam shows no tenderness over the spine, ribs or hips.  Extremities shows no clubbing, cyanosis or edema.  Neurological exam shows no focal neurological deficits.  Skin exam does show a ruddy complexion.  He has some scattered ecchymoses, mostly on his legs. $[email or web address] [email or web address]  HCT 56.5*  PLT 150   No results for input(s): NA, K, CL, CO2, GLUCOSE, BUN, CREATININE, CALCIUM in the last 72 hours.  Blood smear review: Normochromic and normocytic population of red blood cells.  I see no nucleated red blood cells.  There are no teardrop cells.  I see no rouleaux formation.  He has no target cells.  There are no schistocytes or spherocytes.  White blood cells appear normal in morphology maturation.  There are no immature myeloid or lymphoid forms.  Platelets are adequate in number and size.  Pathology: None    Assessment and Plan: [name] is a 62 year old white male.  He has erythrocytosis.  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  Her graph we are sending off the JAK 2 assay to see if he is JAK 2+.  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  He has a normal red cell count.  His MCV is on the  high side.  Typically, with polycythemia, MCV is quite low.  We will see what his erythropoietin level is.  We will see what his iron levels are.  These can certainly help with respect to diagnosing polycythemia vera.  I think he definitely needs baby aspirin.  I  told him to take 81 mg of baby aspirin daily.  I also told him that he can probably go to the [employer] and donate right now.  [facility] once a blood donations every 3 months.  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  We will have to see what the JAK 2 assay shows.  I will plan to get him back depending on the results of his work-up.  I do not think he needs a sonogram of his spleen.  I do not think he needs a bone marrow test.  I spent about 45 minutes with him.  All the time was spent face-to-face.  I counseled him on the situation.  I made my recommendations.  I answered all of his questions.

## 2017-11-22 LAB — FERRITIN: FERRITIN: 154 ng/mL (ref 22–316)

## 2017-11-22 LAB — IRON AND TIBC
IRON: 82 ug/dL (ref 42–163)
Saturation Ratios: 24 % — ABNORMAL LOW (ref 42–163)
TIBC: 349 ug/dL (ref 202–409)
UIBC: 266 ug/dL

## 2017-11-22 LAB — ERYTHROPOIETIN: Erythropoietin: 1.7 m[IU]/mL — ABNORMAL LOW (ref 2.6–18.5)

## 2017-12-16 LAB — JAK2 (INCLUDING V617F AND EXON 12), MPL,& CALR-NEXT GEN SEQ

## 2018-02-03 ENCOUNTER — Encounter: Payer: Self-pay | Admitting: Hematology & Oncology

## 2022-08-04 ENCOUNTER — Emergency Department (HOSPITAL_BASED_OUTPATIENT_CLINIC_OR_DEPARTMENT_OTHER): Payer: BC Managed Care – PPO

## 2022-08-04 ENCOUNTER — Other Ambulatory Visit: Payer: Self-pay

## 2022-08-04 ENCOUNTER — Encounter (HOSPITAL_BASED_OUTPATIENT_CLINIC_OR_DEPARTMENT_OTHER): Payer: Self-pay | Admitting: Emergency Medicine

## 2022-08-04 ENCOUNTER — Emergency Department (HOSPITAL_BASED_OUTPATIENT_CLINIC_OR_DEPARTMENT_OTHER)
Admission: EM | Admit: 2022-08-04 | Discharge: 2022-08-04 | Disposition: A | Discharge status: Home / Self Care | Payer: BC Managed Care – PPO | Attending: Emergency Medicine | Admitting: Emergency Medicine

## 2022-08-04 DIAGNOSIS — N5089 Other specified disorders of the male genital organs: Secondary | ICD-10-CM

## 2022-08-04 DIAGNOSIS — N492 Inflammatory disorders of scrotum: Secondary | ICD-10-CM | POA: Diagnosis present

## 2022-08-04 LAB — COMPREHENSIVE METABOLIC PANEL
ALT: 21 U/L (ref 0–44)
AST: 16 U/L (ref 15–41)
Albumin: 3.7 g/dL (ref 3.5–5.0)
Alkaline Phosphatase: 40 U/L (ref 38–126)
Anion gap: 7 (ref 5–15)
BUN: 16 mg/dL (ref 8–23)
CO2: 25 mmol/L (ref 22–32)
Calcium: 8.7 mg/dL — ABNORMAL LOW (ref 8.9–10.3)
Chloride: 102 mmol/L (ref 98–111)
Creatinine, Ser: 0.81 mg/dL (ref 0.61–1.24)
GFR, Estimated: >60 mL/min (ref >60)
Glucose, Bld: 101 mg/dL — ABNORMAL HIGH (ref 70–99)
Potassium: 3.2 mmol/L — ABNORMAL LOW (ref 3.5–5.1)
Sodium: 134 mmol/L — ABNORMAL LOW (ref 135–145)
Total Bilirubin: 1.2 mg/dL (ref 0.3–1.2)
Total Protein: 6.6 g/dL (ref 6.5–8.1)

## 2022-08-04 LAB — CBC WITH DIFFERENTIAL/PLATELET
Abs Immature Granulocytes: 0.04 10*3/uL (ref 0.00–0.07)
Basophils Absolute: 0.1 10*3/uL (ref 0.0–0.1)
Basophils Relative: 1 %
Eosinophils Absolute: 0.2 10*3/uL (ref 0.0–0.5)
Eosinophils Relative: 2 %
HCT: 60.1 % — ABNORMAL HIGH (ref 39.0–52.0)
Hemoglobin: 20.9 g/dL — ABNORMAL HIGH (ref 13.0–17.0)
Immature Granulocytes: 0 %
Lymphocytes Relative: 23 %
Lymphs Abs: 2.3 10*3/uL (ref 0.7–4.0)
MCH: 33.6 pg (ref 26.0–34.0)
MCHC: 34.8 g/dL (ref 30.0–36.0)
MCV: 96.6 fL (ref 80.0–100.0)
Monocytes Absolute: 1.3 10*3/uL — ABNORMAL HIGH (ref 0.1–1.0)
Monocytes Relative: 13 %
Neutro Abs: 6.3 10*3/uL (ref 1.7–7.7)
Neutrophils Relative %: 61 %
Platelets: 114 10*3/uL — ABNORMAL LOW (ref 150–400)
RBC: 6.22 MIL/uL — ABNORMAL HIGH (ref 4.22–5.81)
RDW: 13.7 % (ref 11.5–15.5)
WBC: 10.2 10*3/uL (ref 4.0–10.5)
nRBC: 0 % (ref 0.0–0.2)

## 2022-08-04 MED ORDER — PIPERACILLIN-TAZOBACTAM 3.375 G IVPB 30 MIN
3.3750 g | Freq: Once | INTRAVENOUS | Status: AC
  Administered 2022-08-04: 3.375 g via INTRAVENOUS
  Filled 2022-08-04: qty 50

## 2022-08-04 MED ORDER — OXYCODONE-ACETAMINOPHEN 10-325 MG PO TABS: 1.0000 | ORAL_TABLET | ORAL | 0 refills | Status: AC | PRN

## 2022-08-04 MED ORDER — SULFAMETHOXAZOLE-TRIMETHOPRIM 800-160 MG PO TABS: 1.0000 | ORAL_TABLET | Freq: Two times a day (BID) | ORAL | 0 refills | Status: AC

## 2022-08-04 MED ORDER — MORPHINE SULFATE (PF) 4 MG/ML IV SOLN
4.0000 mg | Freq: Once | INTRAVENOUS | Status: AC
  Administered 2022-08-04: 4 mg via INTRAVENOUS
  Filled 2022-08-04: qty 1

## 2022-08-04 NOTE — ED Notes (Signed)
Ultrasound at bedside, will return for IV and antbx later.

## 2022-08-04 NOTE — ED Notes (Signed)
Discharge paperwork reviewed entirely with patient, including Rx's and follow up care. Pain was under control. Pt verbalized understanding as well as all parties involved. No questions or concerns voiced at the time of discharge. No acute distress noted.   Pt ambulated out to PVA without incident or assistance.

## 2022-08-04 NOTE — ED Provider Notes (Signed)
[facility] [place] Provider Note   CSN: [id number] Arrival date & time: 08/04/22  1648     History  Chief Complaint  Patient presents with   Groin Swelling    Henry Short is a 67 y.o. male here presenting with left testicular swelling.  Patient states that he noticed a bump in his testicle several weeks ago.  He states that it progressively got larger.  Denies any purulent drainage.  Denies any dysuria.  Patient went to see his primary care doctor today and was sent in for further evaluation.  Denies any fevers or chills.  The history is provided by the patient.       Home Medications Prior to Admission medications   Medication Sig Start Date End Date Taking? Authorizing Provider  atenolol-chlorthalidone (TENORETIC) 50-25 MG tablet Take 1 tablet by mouth daily. 07/29/16   Harrison Mons, PA  benazepril (LOTENSIN) 10 MG tablet Take 1 tablet (10 mg total) by mouth daily. 07/29/16   Harrison Mons, PA  cetirizine (ZYRTEC) 10 MG tablet Take 10 mg by mouth daily.    [provider]  LORazepam (ATIVAN) 0.5 MG tablet TAKE 1/2 TO 1 TABLET BY MOUTH AT BEDTIME AS NEEDED FOR ANXIETY 09/08/16   Wendie Agreste, MD  tadalafil (CIALIS) 5 MG tablet Take 1 tablet (5 mg total) by mouth daily as needed for erectile dysfunction. 12/23/15 01/22/16  Wendie Agreste, MD      Allergies    Patient has no known allergies.    Review of Systems   Review of Systems  Genitourinary:  Positive for scrotal swelling.  All other systems reviewed and are negative.   Physical Exam Updated Vital Signs BP 130/77 (BP Location: Left Arm)   Pulse (!) 57   Temp 97.7 F (36.5 C)   Resp 18   Ht '5\' 9"'$  (1.753 m)   Wt 71.2 kg   SpO2 97%   BMI 23.18 kg/m  Physical Exam Vitals and nursing note reviewed.  Constitutional:      Comments: Chronically ill and slightly uncomfortable  HENT:     Head: Normocephalic.     Nose: Nose normal.     Mouth/Throat:      Mouth: Mucous membranes are moist.  Eyes:     Extraocular Movements: Extraocular movements intact.     Pupils: Pupils are equal, round, and reactive to light.  Cardiovascular:     Rate and Rhythm: Normal rate.     Pulses: Normal pulses.  Pulmonary:     Effort: Pulmonary effort is normal.     Breath sounds: Normal breath sounds.  Abdominal:     General: Abdomen is flat. Bowel sounds are normal.     Palpations: Abdomen is soft.  Genitourinary:    Comments: + Left testicular swelling.  Patient in the area of fluctuance in the left testicular area.  Unable to appreciate cremasteric reflex due to swelling.  No obvious Fournier's gangrene Musculoskeletal:     Cervical back: Normal range of motion and neck supple.  Skin:    General: Skin is warm.     Capillary Refill: Capillary refill takes less than 2 seconds.  Neurological:     General: No focal deficit present.     Mental Status: He is oriented to person, place, and time.  Psychiatric:        Mood and Affect: Mood normal.        Behavior: Behavior normal.     ED Results /  Procedures / Treatments   Labs (all labs ordered are listed, but only abnormal results are displayed) Labs Reviewed  CBC WITH DIFFERENTIAL/PLATELET  COMPREHENSIVE METABOLIC PANEL    EKG None  Radiology No results found.  Procedures Procedures    Medications Ordered in ED Medications  piperacillin-tazobactam (ZOSYN) IVPB 3.375 g (has no administration in time range)  morphine (PF) 4 MG/ML injection 4 mg (has no administration in time range)    ED Course/ Medical Decision Making/ A&P                             Medical Decision Making Henry Short is a 67 y.o. male here presenting with testicular pain and swelling.  Concern for possible testicular cellulitis versus abscess versus mass.  Plan to get CBC and CMP and loaded with IV Zosyn and also get testicular ultrasound.  Patient may need an I&D.  7:56 PM Patient's white blood cell count is  normal.  Ultrasound showed scrotal edema with 2.7 cm phlegmon.  I discussed options including local I&D versus outpatient antibiotics.  Patient would like to try antibiotics for now.  I also referred patient to urology outpatient.  Problems Addressed: Scrotal erythema: acute illness or injury  Amount and/or Complexity of Data Reviewed Labs: ordered. Decision-making details documented in ED Course. Radiology: ordered and independent interpretation performed. Decision-making details documented in ED Course.  Risk Prescription drug management.    Final Clinical Impression(s) / ED Diagnoses Final diagnoses:  None    Rx / DC Orders ED Discharge Orders     None         Drenda Freeze, MD 08/04/22 1958

## 2022-08-04 NOTE — ED Triage Notes (Signed)
Pt reports he was at another doctor's office. Was told he has an abscess on his scrotum that they could not drain in the office, so he was told to come here.Says symptoms became worse a few days ago. Denies urinary symptoms

## 2022-08-04 NOTE — Discharge Instructions (Signed)
As we discussed, you have a cellulitis with a phlegmon on your scrotum.  I recommend that you apply bacitracin on the wound and try not to sit too long.  Please also wear loose pants  I have prescribed Bactrim twice daily for a week for you.  You can take oxycodone if you have severe pain  Please call urology tomorrow for follow-up.  If it gets worse you may need I&D  Return to ER if you have severe scrotal pain or swelling or fever

## 2022-08-04 NOTE — ED Notes (Signed)
Cannot discharge, registration in chart.
